# Patient Record
Sex: Male | Born: 1985 | Race: Black or African American | Hispanic: No | Marital: Single | Smoking: Never smoker
Health system: Southern US, Community
[De-identification: ages and names within clinical notes are randomized; demographics above are authoritative.]

---
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
## 2005-01-08 ENCOUNTER — Ambulatory Visit: Payer: Self-pay | Admitting: Family Medicine

## 2005-08-06 ENCOUNTER — Ambulatory Visit: Payer: Self-pay | Admitting: Internal Medicine

## 2005-08-09 ENCOUNTER — Ambulatory Visit: Payer: Self-pay | Admitting: Internal Medicine

## 2007-05-12 ENCOUNTER — Emergency Department (HOSPITAL_COMMUNITY): Admission: EM | Admit: 2007-05-12 | Discharge: 2007-05-12 | Payer: Self-pay | Admitting: Family Medicine

## 2007-05-30 ENCOUNTER — Emergency Department (HOSPITAL_COMMUNITY): Admission: EM | Admit: 2007-05-30 | Discharge: 2007-05-30 | Payer: Self-pay | Admitting: Family Medicine

## 2011-04-13 ENCOUNTER — Emergency Department (HOSPITAL_BASED_OUTPATIENT_CLINIC_OR_DEPARTMENT_OTHER)
Admission: EM | Admit: 2011-04-13 | Discharge: 2011-04-13 | Disposition: A | Discharge status: Home / Self Care | Payer: Self-pay | Attending: Emergency Medicine | Admitting: Emergency Medicine

## 2011-04-13 DIAGNOSIS — J40 Bronchitis, not specified as acute or chronic: Secondary | ICD-10-CM | POA: Insufficient documentation

## 2011-09-16 LAB — POCT H PYLORI SCREEN: H. PYLORI SCREEN, POC: POSITIVE — AB

## 2011-09-16 LAB — POCT URINALYSIS DIP (DEVICE)
Glucose, UA: NEGATIVE
Hgb urine dipstick: NEGATIVE
Ketones, ur: NEGATIVE
Nitrite: NEGATIVE
Operator id: 239701
Specific Gravity, Urine: 1.025
Urobilinogen, UA: 1
pH: 6.5

## 2011-11-09 ENCOUNTER — Emergency Department (HOSPITAL_COMMUNITY)
Admission: EM | Admit: 2011-11-09 | Discharge: 2011-11-09 | Disposition: A | Discharge status: Home / Self Care | Payer: Self-pay | Attending: Emergency Medicine | Admitting: Emergency Medicine

## 2011-11-09 ENCOUNTER — Encounter: Payer: Self-pay | Admitting: Emergency Medicine

## 2011-11-09 DIAGNOSIS — K2970 Gastritis, unspecified, without bleeding: Secondary | ICD-10-CM | POA: Insufficient documentation

## 2011-11-09 DIAGNOSIS — R110 Nausea: Secondary | ICD-10-CM | POA: Insufficient documentation

## 2011-11-09 DIAGNOSIS — R1013 Epigastric pain: Secondary | ICD-10-CM | POA: Insufficient documentation

## 2011-11-09 DIAGNOSIS — K2990 Gastroduodenitis, unspecified, without bleeding: Secondary | ICD-10-CM | POA: Insufficient documentation

## 2011-11-09 LAB — URINALYSIS, ROUTINE W REFLEX MICROSCOPIC
Bilirubin Urine: NEGATIVE
Glucose, UA: NEGATIVE mg/dL
Hgb urine dipstick: NEGATIVE
Ketones, ur: NEGATIVE mg/dL
Leukocytes, UA: NEGATIVE
Nitrite: NEGATIVE
Protein, ur: NEGATIVE mg/dL
Specific Gravity, Urine: 1.025 (ref 1.005–1.030)
Urobilinogen, UA: 1 mg/dL (ref 0.0–1.0)
pH: 6.5 (ref 5.0–8.0)

## 2011-11-09 LAB — COMPREHENSIVE METABOLIC PANEL
ALT: 21 U/L (ref 0–53)
AST: 26 U/L (ref 0–37)
Albumin: 4.1 g/dL (ref 3.5–5.2)
Alkaline Phosphatase: 59 U/L (ref 39–117)
BUN: 9 mg/dL (ref 6–23)
CO2: 31 mEq/L (ref 19–32)
Calcium: 9.6 mg/dL (ref 8.4–10.5)
Chloride: 101 mEq/L (ref 96–112)
Creatinine, Ser: 0.99 mg/dL (ref 0.50–1.35)
GFR calc Af Amer: >90 mL/min (ref >90)
GFR calc non Af Amer: >90 mL/min (ref >90)
Glucose, Bld: 98 mg/dL (ref 70–99)
Potassium: 3.7 mEq/L (ref 3.5–5.1)
Sodium: 139 mEq/L (ref 135–145)
Total Bilirubin: 0.2 mg/dL — ABNORMAL LOW (ref 0.3–1.2)
Total Protein: 7.6 g/dL (ref 6.0–8.3)

## 2011-11-09 LAB — CBC
HCT: 44.1 % (ref 39.0–52.0)
Hemoglobin: 15.4 g/dL (ref 13.0–17.0)
MCH: 30.8 pg (ref 26.0–34.0)
MCHC: 34.9 g/dL (ref 30.0–36.0)
MCV: 88.2 fL (ref 78.0–100.0)
Platelets: 222 10*3/uL (ref 150–400)
RBC: 5 MIL/uL (ref 4.22–5.81)
RDW: 12.3 % (ref 11.5–15.5)
WBC: 6.2 10*3/uL (ref 4.0–10.5)

## 2011-11-09 LAB — DIFFERENTIAL
Basophils Absolute: 0.1 10*3/uL (ref 0.0–0.1)
Basophils Relative: 1 % (ref 0–1)
Eosinophils Absolute: 0.2 10*3/uL (ref 0.0–0.7)
Eosinophils Relative: 4 % (ref 0–5)
Lymphocytes Relative: 38 % (ref 12–46)
Lymphs Abs: 2.3 10*3/uL (ref 0.7–4.0)
Monocytes Absolute: 1 10*3/uL (ref 0.1–1.0)
Monocytes Relative: 16 % — ABNORMAL HIGH (ref 3–12)
Neutro Abs: 2.6 10*3/uL (ref 1.7–7.7)
Neutrophils Relative %: 42 % — ABNORMAL LOW (ref 43–77)

## 2011-11-09 LAB — LIPASE, BLOOD: Lipase: 57 U/L (ref 11–59)

## 2011-11-09 MED ORDER — GI COCKTAIL ~~LOC~~
30.0000 mL | Freq: Once | ORAL | Status: AC
  Administered 2011-11-09: 30 mL via ORAL
  Filled 2011-11-09: qty 30

## 2011-11-09 MED ORDER — FAMOTIDINE 20 MG PO TABS: 20.0000 mg | ORAL_TABLET | Freq: Two times a day (BID) | ORAL | Status: DC

## 2011-11-09 MED ORDER — SODIUM CHLORIDE 0.9 % IV BOLUS (SEPSIS)
1000.0000 mL | Freq: Once | INTRAVENOUS | Status: AC
  Administered 2011-11-09: 1000 mL via INTRAVENOUS

## 2011-11-09 NOTE — ED Notes (Signed)
PT. REPORTS PROGRESSING MID ABDOMINAL PAIN WITH SLIGHT NAUSEA , NO VOMITTING OR DIARRHEA ,  NO FEVER OR CHILLS .

## 2011-11-09 NOTE — ED Provider Notes (Signed)
History     CSN: [id number] Arrival date & time: 11/09/2011  2:16 AM   First MD Initiated Contact with Patient 11/09/11 [phone]      Chief Complaint  Patient presents with  . Abdominal Pain    (Consider location/radiation/quality/duration/timing/severity/associated sxs/prior treatment) HPI Comments: Patient is a 25 year old male with no significant past medical history other than intermittent headaches who presents with mid and upper abdominal pain described as a burning, acute in onset approximately 1:00. It has been constant for 2 hours, 9-1/2 out of 10 in intensity and is made worse with palpation of the abdomen. He admits to associated nausea but no diarrhea, vomiting, coughing, fevers or chills. He states that he had a late night meal approximately 3 hours for the abdominal pain started and admits to having [place] food twice in the last 12 hours. He also admits to using daily Advil or ibuprofen for his headaches and minor aches and pains. He has no history of prior abdominal surgery  Patient is a 25 y.o. male presenting with abdominal pain. The history is provided by the patient and a relative.  Abdominal Pain The primary symptoms of the illness include abdominal pain and nausea. The primary symptoms of the illness do not include fever, fatigue, shortness of breath, vomiting, diarrhea, hematemesis, hematochezia or dysuria.    History reviewed. No pertinent past medical history.  History reviewed. No pertinent past surgical history.  No family history on file.  History  Substance Use Topics  . Smoking status: Never Smoker   . Smokeless tobacco: Not on file  . Alcohol Use: Yes     OCCASIONAL      Review of Systems  Constitutional: Negative for fever and fatigue.  Respiratory: Negative for shortness of breath.   Gastrointestinal: Positive for nausea and abdominal pain. Negative for vomiting, diarrhea, hematochezia and hematemesis.  Genitourinary: Negative for dysuria.  All  other systems reviewed and are negative.    Allergies  Review of patient's allergies indicates no known allergies.  Home Medications   Current Outpatient Rx  Name Route Sig Dispense Refill  . FAMOTIDINE 20 MG PO TABS Oral Take 1 tablet (20 mg total) by mouth 2 (two) times daily. 30 tablet 0    BP 135/55  Pulse 69  Temp(Src) 97.5 F (36.4 C) (Oral)  Resp 18  Ht 5\' 9"  (1.753 m)  Wt 135 lb (61.236 kg)  BMI 19.94 kg/m2  SpO2 97%  Physical Exam  Nursing note and vitals reviewed. Constitutional: He appears well-developed and well-nourished. No distress.  HENT:  Head: Normocephalic and atraumatic.  Mouth/Throat: Oropharynx is clear and moist. No oropharyngeal exudate.  Eyes: Conjunctivae and EOM are normal. Pupils are equal, round, and reactive to light. Right eye exhibits no discharge. Left eye exhibits no discharge. No scleral icterus.  Neck: Normal range of motion. Neck supple. No JVD present. No thyromegaly present.  Cardiovascular: Normal rate, regular rhythm, normal heart sounds and intact distal pulses.  Exam reveals no gallop and no friction rub.   No murmur heard. Pulmonary/Chest: Effort normal and breath sounds normal. No respiratory distress. He has no wheezes. He has no rales.  Abdominal: Soft. Bowel sounds are normal. He exhibits no distension and no mass. There is tenderness ( epigastric ttp with guarding but non peritoneal.  no lower abd ttp.).  Musculoskeletal: Normal range of motion. He exhibits no edema and no tenderness.  Lymphadenopathy:    He has no cervical adenopathy.  Neurological: He is alert. Coordination normal.  Skin: Skin is warm and dry. No rash noted. No erythema.  Psychiatric: He has a normal mood and affect. His behavior is normal.    ED Course  Procedures (including critical care time)  Labs Reviewed  DIFFERENTIAL - Abnormal; Notable for the following:    Neutrophils Relative 42 (*)    Monocytes Relative 16 (*)    All other components  within normal limits  COMPREHENSIVE METABOLIC PANEL - Abnormal; Notable for the following:    Total Bilirubin 0.2 (*)    All other components within normal limits  URINALYSIS, ROUTINE W REFLEX MICROSCOPIC  CBC  LIPASE, BLOOD   No results found.   1. Gastritis       MDM  Tenderness consistent with epigastric pain. Consider pancreatitis, cholecystitis, gout appendicitis. GI cocktail offered. Patient has heavy NSAID use to be consistent with a peptic ulcer.    Patient has improved significantly after GI cocktail, has no pain at this time, labs reveal no significant findings concerning for pancreatitis or cholecystitis. Patient counseled on using NSAID, Pepcid.    Vida Roller, MD 11/09/11 [phone]

## 2011-11-09 NOTE — ED Notes (Signed)
Pt d/c home with mother. Pt in NAD. Pt ambulated with a quick, steady gait.

## 2012-07-17 ENCOUNTER — Encounter (HOSPITAL_BASED_OUTPATIENT_CLINIC_OR_DEPARTMENT_OTHER): Payer: Self-pay | Admitting: *Deleted

## 2012-07-17 ENCOUNTER — Emergency Department (HOSPITAL_BASED_OUTPATIENT_CLINIC_OR_DEPARTMENT_OTHER)
Admission: EM | Admit: 2012-07-17 | Discharge: 2012-07-17 | Disposition: A | Discharge status: Home / Self Care | Payer: No Typology Code available for payment source | Attending: Emergency Medicine | Admitting: Emergency Medicine

## 2012-07-17 DIAGNOSIS — R51 Headache: Secondary | ICD-10-CM | POA: Insufficient documentation

## 2012-07-17 DIAGNOSIS — Y92410 Unspecified street and highway as the place of occurrence of the external cause: Secondary | ICD-10-CM | POA: Insufficient documentation

## 2012-07-17 MED ORDER — IBUPROFEN 600 MG PO TABS: 600.0000 mg | ORAL_TABLET | Freq: Four times a day (QID) | ORAL | Status: DC | PRN
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
## 2012-07-18 ENCOUNTER — Emergency Department (HOSPITAL_BASED_OUTPATIENT_CLINIC_OR_DEPARTMENT_OTHER): Payer: No Typology Code available for payment source

## 2012-07-18 ENCOUNTER — Emergency Department (HOSPITAL_BASED_OUTPATIENT_CLINIC_OR_DEPARTMENT_OTHER)
Admission: EM | Admit: 2012-07-18 | Discharge: 2012-07-18 | Disposition: A | Discharge status: Home / Self Care | Payer: No Typology Code available for payment source | Attending: Emergency Medicine | Admitting: Emergency Medicine

## 2012-07-18 ENCOUNTER — Encounter (HOSPITAL_BASED_OUTPATIENT_CLINIC_OR_DEPARTMENT_OTHER): Payer: Self-pay | Admitting: *Deleted

## 2012-07-18 DIAGNOSIS — R51 Headache: Secondary | ICD-10-CM | POA: Insufficient documentation

## 2012-07-18 DIAGNOSIS — S20219A Contusion of unspecified front wall of thorax, initial encounter: Secondary | ICD-10-CM

## 2012-07-18 MED ORDER — HYDROCODONE-ACETAMINOPHEN 5-325 MG PO TABS: 2.0000 | ORAL_TABLET | ORAL | Status: AC | PRN
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
## 2012-07-19 NOTE — ED Provider Notes (Signed)
Medical screening examination/treatment/procedure(s) were performed by non-physician practitioner and as supervising physician I was immediately available for consultation/collaboration.    Gwyneth Sprout, MD 07/19/12 2238

## 2013-11-13 ENCOUNTER — Emergency Department (HOSPITAL_BASED_OUTPATIENT_CLINIC_OR_DEPARTMENT_OTHER): Payer: Self-pay

## 2013-11-13 ENCOUNTER — Emergency Department (HOSPITAL_BASED_OUTPATIENT_CLINIC_OR_DEPARTMENT_OTHER)
Admission: EM | Admit: 2013-11-13 | Discharge: 2013-11-13 | Disposition: A | Discharge status: Home / Self Care | Payer: No Typology Code available for payment source | Attending: Emergency Medicine | Admitting: Emergency Medicine

## 2013-11-13 ENCOUNTER — Encounter (HOSPITAL_BASED_OUTPATIENT_CLINIC_OR_DEPARTMENT_OTHER): Payer: Self-pay | Admitting: Emergency Medicine

## 2013-11-13 DIAGNOSIS — S20219A Contusion of unspecified front wall of thorax, initial encounter: Secondary | ICD-10-CM

## 2013-11-13 DIAGNOSIS — Y92410 Unspecified street and highway as the place of occurrence of the external cause: Secondary | ICD-10-CM | POA: Insufficient documentation

## 2013-11-13 DIAGNOSIS — S139XXA Sprain of joints and ligaments of unspecified parts of neck, initial encounter: Secondary | ICD-10-CM | POA: Insufficient documentation

## 2013-11-13 DIAGNOSIS — Y9389 Activity, other specified: Secondary | ICD-10-CM | POA: Insufficient documentation

## 2013-11-13 DIAGNOSIS — S161XXA Strain of muscle, fascia and tendon at neck level, initial encounter: Secondary | ICD-10-CM

## 2013-11-13 MED ORDER — IBUPROFEN 600 MG PO TABS: 600.0000 mg | ORAL_TABLET | Freq: Four times a day (QID) | ORAL | Status: AC | PRN

## 2013-11-13 MED ORDER — CYCLOBENZAPRINE HCL 10 MG PO TABS: 10.0000 mg | ORAL_TABLET | Freq: Two times a day (BID) | ORAL | Status: AC | PRN
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
## 2016-03-28 ENCOUNTER — Encounter (HOSPITAL_BASED_OUTPATIENT_CLINIC_OR_DEPARTMENT_OTHER): Payer: Self-pay | Admitting: *Deleted

## 2016-03-28 ENCOUNTER — Emergency Department (HOSPITAL_BASED_OUTPATIENT_CLINIC_OR_DEPARTMENT_OTHER)
Admission: EM | Admit: 2016-03-28 | Discharge: 2016-03-28 | Disposition: A | Discharge status: Home / Self Care | Payer: 59 | Attending: Emergency Medicine | Admitting: Emergency Medicine

## 2016-03-28 DIAGNOSIS — R1013 Epigastric pain: Secondary | ICD-10-CM

## 2016-03-28 DIAGNOSIS — K2970 Gastritis, unspecified, without bleeding: Secondary | ICD-10-CM | POA: Insufficient documentation

## 2016-03-28 LAB — CBC WITH DIFFERENTIAL/PLATELET
Basophils Absolute: 0 10*3/uL (ref 0.0–0.1)
Basophils Relative: 1 %
Eosinophils Absolute: 0.4 10*3/uL (ref 0.0–0.7)
Eosinophils Relative: 11 %
HCT: 42.3 % (ref 39.0–52.0)
Hemoglobin: 14.8 g/dL (ref 13.0–17.0)
Lymphocytes Relative: 43 %
Lymphs Abs: 1.5 10*3/uL (ref 0.7–4.0)
MCH: 31 pg (ref 26.0–34.0)
MCHC: 35 g/dL (ref 30.0–36.0)
MCV: 88.5 fL (ref 78.0–100.0)
Monocytes Absolute: 0.4 10*3/uL (ref 0.1–1.0)
Monocytes Relative: 13 %
Neutro Abs: 1.2 10*3/uL — ABNORMAL LOW (ref 1.7–7.7)
Neutrophils Relative %: 34 %
Platelets: 219 10*3/uL (ref 150–400)
RBC: 4.78 MIL/uL (ref 4.22–5.81)
RDW: 12 % (ref 11.5–15.5)
WBC: 3.5 10*3/uL — ABNORMAL LOW (ref 4.0–10.5)

## 2016-03-28 LAB — URINALYSIS, ROUTINE W REFLEX MICROSCOPIC
Bilirubin Urine: NEGATIVE
Glucose, UA: NEGATIVE mg/dL
Hgb urine dipstick: NEGATIVE
Ketones, ur: NEGATIVE mg/dL
Leukocytes, UA: NEGATIVE
Nitrite: NEGATIVE
Protein, ur: NEGATIVE mg/dL
Specific Gravity, Urine: 1.023 (ref 1.005–1.030)
pH: 6.5 (ref 5.0–8.0)

## 2016-03-28 LAB — COMPREHENSIVE METABOLIC PANEL
ALT: 30 U/L (ref 17–63)
AST: 29 U/L (ref 15–41)
Albumin: 4.4 g/dL (ref 3.5–5.0)
Alkaline Phosphatase: 56 U/L (ref 38–126)
Anion gap: 6 (ref 5–15)
BUN: 10 mg/dL (ref 6–20)
CO2: 29 mmol/L (ref 22–32)
Calcium: 9.6 mg/dL (ref 8.9–10.3)
Chloride: 105 mmol/L (ref 101–111)
Creatinine, Ser: 0.88 mg/dL (ref 0.61–1.24)
GFR calc Af Amer: >60 mL/min (ref >60)
GFR calc non Af Amer: >60 mL/min (ref >60)
Glucose, Bld: 71 mg/dL (ref 65–99)
Potassium: 4.2 mmol/L (ref 3.5–5.1)
Sodium: 140 mmol/L (ref 135–145)
Total Bilirubin: 0.6 mg/dL (ref 0.3–1.2)
Total Protein: 7.4 g/dL (ref 6.5–8.1)

## 2016-03-28 LAB — LIPASE, BLOOD: Lipase: 28 U/L (ref 11–51)

## 2016-03-28 MED ORDER — GI COCKTAIL ~~LOC~~
30.0000 mL | Freq: Once | ORAL | Status: AC
  Administered 2016-03-28: 30 mL via ORAL
  Filled 2016-03-28: qty 30

## 2016-03-28 MED ORDER — PANTOPRAZOLE SODIUM 40 MG PO TBEC
40.0000 mg | DELAYED_RELEASE_TABLET | Freq: Once | ORAL | Status: AC
Start: 2016-03-28 — End: 2016-03-28
  Administered 2016-03-28: 40 mg via ORAL
  Filled 2016-03-28: qty 1

## 2016-03-28 MED ORDER — PANTOPRAZOLE SODIUM 20 MG PO TBEC: 40.0000 mg | DELAYED_RELEASE_TABLET | Freq: Every day | ORAL | Status: AC

## 2016-03-28 NOTE — Discharge Instructions (Signed)

## 2016-03-28 NOTE — ED Notes (Signed)
1.5 weeks of upper abd pain. Denies n/v/d

## 2016-03-28 NOTE — ED Provider Notes (Signed)
CSN: [id number]     Arrival date & time 03/28/16  1144 History   First MD Initiated Contact with Patient 03/28/16 1331     Chief Complaint  Patient presents with  . Abdominal Pain     (Consider location/radiation/quality/duration/timing/severity/associated sxs/prior Treatment) Patient is a 30 y.o. male presenting with abdominal pain.  Abdominal Pain Pain location:  Epigastric Pain quality: aching   Duration: 1.5. Timing:  Constant Context: not eating   Context comment:  Feels like always hungry but not, will eat and feels the same. Hx of ulcer years ago Worsened by:  Nothing tried Ineffective treatments:  Eating (changing diet, no soda, pepto bismol) Associated symptoms: no chest pain, no constipation, no cough, no diarrhea, no dysuria, no fever, no hematuria, no melena, no nausea, no shortness of breath, no sore throat and no vomiting   Risk factors: no NSAID use   Risk factors comment:  Vanquish   History reviewed. No pertinent past medical history. History reviewed. No pertinent past surgical history. No family history on file. Social History  Substance Use Topics  . Smoking status: Never Smoker   . Smokeless tobacco: Never Used  . Alcohol Use: Yes     Comment: 1x month    Review of Systems  Constitutional: Negative for fever.  HENT: Negative for sore throat.   Eyes: Negative for visual disturbance.  Respiratory: Negative for cough and shortness of breath.   Cardiovascular: Negative for chest pain.  Gastrointestinal: Positive for abdominal pain. Negative for nausea, vomiting, diarrhea, constipation and melena.  Genitourinary: Negative for dysuria, hematuria and difficulty urinating.  Musculoskeletal: Negative for back pain and neck stiffness.  Skin: Negative for rash.  Neurological: Negative for syncope and headaches.      Allergies  Review of patient's allergies indicates no known allergies.  Home Medications   Prior to Admission medications   Medication  Sig Start Date End Date Taking? Authorizing Provider  cyclobenzaprine (FLEXERIL) 10 MG tablet Take 1 tablet (10 mg total) by mouth 2 (two) times daily as needed for muscle spasms. 11/13/13   Rolan Bucco, MD  ibuprofen (ADVIL,MOTRIN) 600 MG tablet Take 1 tablet (600 mg total) by mouth every 6 (six) hours as needed. 11/13/13   Rolan Bucco, MD  pantoprazole (PROTONIX) 20 MG tablet Take 2 tablets (40 mg total) by mouth daily. 03/28/16   Alvira Monday, MD   BP 102/57 mmHg  Pulse 50  Temp(Src) 98.2 F (36.8 C) (Oral)  Resp 18  Ht  (1.727 m)  Wt 135 lb (61.236 kg)  BMI 20.53 kg/m2  SpO2 99% Physical Exam  Constitutional: He is oriented to person, place, and time. He appears well-developed and well-nourished. No distress.  HENT:  Head: Normocephalic and atraumatic.  Eyes: Conjunctivae and EOM are normal.  Neck: Normal range of motion.  Cardiovascular: Normal rate, regular rhythm, normal heart sounds and intact distal pulses.  Exam reveals no gallop and no friction rub.   No murmur heard. Pulmonary/Chest: Effort normal and breath sounds normal. No respiratory distress. He has no wheezes. He has no rales.  Abdominal: Soft. He exhibits no distension. There is no tenderness. There is no guarding.  Musculoskeletal: He exhibits no edema.  Neurological: He is alert and oriented to person, place, and time.  Skin: Skin is warm and dry. He is not diaphoretic.  Nursing note and vitals reviewed.   ED Course  Procedures (including critical care time) Labs Review Labs Reviewed  CBC WITH DIFFERENTIAL/PLATELET - Abnormal; Notable for the  following:    WBC 3.5 (*)    Neutro Abs 1.2 (*)    All other components within normal limits  URINALYSIS, ROUTINE W REFLEX MICROSCOPIC (NOT AT [facility])  COMPREHENSIVE METABOLIC PANEL  LIPASE, BLOOD    Imaging Review No results found. I have personally reviewed and evaluated these images and lab results as part of my medical decision-making.   EKG  Interpretation None      MDM   Final diagnoses:  Epigastric pain  Gastritis   30 year old male with past history of peptic ulcer disease presents with concern of aching epigastric pain that began a week and half ago. Labs obtained show no sign of pancreatitis, hepatitis. Exam is benign and doubt cholecystitis.  Patient likely has gastritis.  He is given a prescription for Protonix, and GI cocktail in the emergency department. Patient discharged in stable condition with understanding of reasons to return.     Alvira MondayErin Velisa Regnier, MD 03/28/16 1739

## 2016-03-28 NOTE — ED Notes (Signed)
Pt given d/c instructions as per chart. Verbalizes understanding. No questions. Rx x 1
